# Patient Record
Sex: Male | Born: 1965 | Race: White | Hispanic: No | Smoking: Never smoker
Health system: Southern US, Community
[De-identification: ages and names within clinical notes are randomized; demographics above are authoritative.]

## PROBLEM LIST (undated history)

## (undated) HISTORY — PX: TONSILLECTOMY: SUR1361

## (undated) HISTORY — PX: OTHER SURGICAL HISTORY: SHX169

---

## 1998-05-04 ENCOUNTER — Emergency Department (HOSPITAL_COMMUNITY): Admission: EM | Admit: 1998-05-04 | Discharge: 1998-05-04 | Payer: Self-pay | Admitting: Emergency Medicine

## 2010-06-23 ENCOUNTER — Encounter: Payer: Self-pay | Admitting: Orthopedic Surgery

## 2010-06-23 ENCOUNTER — Emergency Department (HOSPITAL_COMMUNITY): Admission: EM | Admit: 2010-06-23 | Discharge: 2010-06-23 | Payer: Self-pay | Admitting: Emergency Medicine

## 2010-06-25 ENCOUNTER — Ambulatory Visit: Payer: Self-pay | Admitting: Orthopedic Surgery

## 2010-06-25 DIAGNOSIS — S82899A Other fracture of unspecified lower leg, initial encounter for closed fracture: Secondary | ICD-10-CM | POA: Insufficient documentation

## 2010-06-26 ENCOUNTER — Ambulatory Visit (HOSPITAL_COMMUNITY)
Admission: RE | Admit: 2010-06-26 | Discharge: 2010-06-26 | Payer: Self-pay | Source: Home / Self Care | Admitting: Orthopedic Surgery

## 2010-06-26 ENCOUNTER — Ambulatory Visit: Payer: Self-pay | Admitting: Orthopedic Surgery

## 2010-06-28 ENCOUNTER — Ambulatory Visit: Payer: Self-pay | Admitting: Orthopedic Surgery

## 2010-07-10 ENCOUNTER — Ambulatory Visit: Payer: Self-pay | Admitting: Orthopedic Surgery

## 2010-07-17 ENCOUNTER — Ambulatory Visit: Payer: Self-pay | Admitting: Orthopedic Surgery

## 2010-08-07 ENCOUNTER — Ambulatory Visit: Payer: Self-pay | Admitting: Orthopedic Surgery

## 2010-08-08 ENCOUNTER — Encounter: Payer: Self-pay | Admitting: Orthopedic Surgery

## 2010-09-18 ENCOUNTER — Encounter: Payer: Self-pay | Admitting: Orthopedic Surgery

## 2010-09-18 ENCOUNTER — Ambulatory Visit
Admission: RE | Admit: 2010-09-18 | Discharge: 2010-09-18 | Payer: Self-pay | Source: Home / Self Care | Attending: Orthopedic Surgery | Admitting: Orthopedic Surgery

## 2010-09-19 LAB — BASIC METABOLIC PANEL
Chloride: 100 mEq/L (ref 96–112)
GFR calc Af Amer: >60 mL/min (ref >60)
Potassium: 4.2 mEq/L (ref 3.5–5.1)
Sodium: 137 mEq/L (ref 135–145)

## 2010-09-19 LAB — CBC
HCT: 43.8 % (ref 39.0–52.0)
MCV: 90.7 fL (ref 78.0–100.0)
Platelets: 333 10*3/uL (ref 150–400)
RBC: 4.83 MIL/uL (ref 4.22–5.81)
WBC: 7.6 10*3/uL (ref 4.0–10.5)

## 2010-09-21 ENCOUNTER — Ambulatory Visit (HOSPITAL_COMMUNITY)
Admission: RE | Admit: 2010-09-21 | Discharge: 2010-09-21 | Payer: Self-pay | Source: Home / Self Care | Attending: Orthopedic Surgery | Admitting: Orthopedic Surgery

## 2010-09-24 ENCOUNTER — Ambulatory Visit
Admission: RE | Admit: 2010-09-24 | Discharge: 2010-09-24 | Payer: Self-pay | Source: Home / Self Care | Attending: Orthopedic Surgery | Admitting: Orthopedic Surgery

## 2010-09-24 NOTE — Op Note (Addendum)
  [name], Daniel Beard              ACCOUNT NO.:  [id number]  MEDICAL RECORD NO.:  [id number]          PATIENT TYPE:  AMB  LOCATION:  DAY                           FACILITY:  [facility]  PHYSICIAN:  Vickki Hearing, M.D.DATE OF BIRTH:  1966-08-15  DATE OF PROCEDURE:  09/21/2010 DATE OF DISCHARGE:  09/21/2010                              OPERATIVE REPORT   PREOPERATIVE DIAGNOSIS:  Right ankle fracture.  POSTOPERATIVE DIAGNOSIS:  Right ankle fracture.  PROCEDURE:  Hardware removal, right ankle.  SURGEON:  Vickki Hearing, MD  ASSISTED BY:  [place].  OPERATIVE FINDINGS:  Intact syndesmosis screws x2.  Stable stress test of the syndesmosis and tib-fib ligaments.  DETAILS:  A 45 year old male sustained a high fibular fracture, Weber C type syndesmosis disrupture and fracture of the left ankle presented in October 2011.  He was treated with open treatment internal fixation and nonweightbearing for 12 weeks and then presented back for screw removal and exam under anesthesia.  The time-out procedure was executed.  Site marking and chart update were performed.  The patient was taken to the operating suite, where he had general anesthesia after sterile prep and drape of his left lower extremity and execution of the time-out.  A lateral incision was made over the 2 screws.  Full-thickness flaps were created.  Hemostasis was maintained. The 2 screws were backed out to the fibula and then a stress test was done on the tib-fib joint by externally rotating the foot.  There was no motion in the syndesmosis.  The talus was reduced in the mortise and the screws were removed.  The wounds were irrigated and closed with 2-0 Monocryl and 3-0 interrupted nylon sutures and then we injected 20 mL of Marcaine with epinephrine.  Sterile dressings and air cast were applied.  The patient is allowed to weight bear as tolerated with crutches.  He is discharged on ibuprofen and  Percocet.     Vickki Hearing, M.D.     SEH/MEDQ  D:  09/21/2010  T:  09/22/2010  Job:  [id number]  Electronically Signed by Fuller Canada M.D. on 09/24/2010 04:43:35 PM

## 2010-09-25 ENCOUNTER — Ambulatory Visit: Admit: 2010-09-25 | Payer: Self-pay | Admitting: Orthopedic Surgery

## 2010-09-25 NOTE — Assessment & Plan Note (Signed)
Summary: POST OP 1/LT ANKLE SURG 06/26/10/[facility]   Visit Type:  Follow-up Referring Provider:  [facility] er Primary Provider:  Dr. Regino Schultze  CC:  post op 1 ankle.  History of Present Illness: I saw Daniel Beard in the office today for a followup visit.  He is a 45 years old man with the complaint of:  post op 1 left ankle.  OTIF left ankle 06/26/10.  POD 2.  Today for dressing change.  Percocet 5 for pain, helps takes 1 q 4 hrs.  Ibuprofen 800  1 per day.  Doing well.  He is nonweightbearing  He has 2 blisters on his ankle  I advised him that he had a very difficult surgery and required multiple ligament suture repair including suture anchor repair  His x-ray looks acceptable to me at this point.  We will follow closely with x-rays serially including one on his next visit     Allergies: No Known Drug Allergies   Other Orders: Post-Op Check (60454)  Patient Instructions: 1)  return on the 15th for xrays and cast    Orders Added: 1)  Post-Op Check [09811]

## 2010-09-25 NOTE — Assessment & Plan Note (Signed)
Summary: POST OP 2/RECK/XR/LT ANKLE/FINANICAL ASSIST/CAF   Visit Type:  Follow-up Referring Provider:  ap er Primary Provider:  Dr. Regino Schultze  CC:  post op 2 left ankle.  History of Present Illness: I saw Daniel Beard in the office today for a followup visit.  He is a 45 years old man with the complaint of:  post op 1 left ankle.  OTIF left ankle 06/26/10. syndesmosis rupture proximal fibular fracture POD 14.  Today for xrays and cast.  Percocet 5 for pain, helps takes 1 q 4 hrs as needed.  Ibuprofen 800  1 per day.  Doing well. he did fall last week; said he landed on his rear end   He is nonweightbearing with crutches.  He has several blisters on his ankle and foot, which we relieved today and covered with sterile dressings. He is placed back in a posterior splint to allow for recheck next week.  X-rays are obtained today.  3 views of the LEFT ankle, show 2 syndesmotic screws fixating LEFT ankle syndesmosis disruption. His talocrural angle is 83. The spaces around the mortise are 5 mm. His overlap on the AP view tib-fib is 5 mm. 1 cm above the joint is a 6 mm space. All within acceptable range for this injury.        Allergies: No Known Drug Allergies   Impression & Recommendations:  Problem # 1:  AFTERCARE FOLLOW SURGERY MUSCULOSKEL SYSTEM NEC (ICD-V58.78) Assessment Improved  Orders: Post-Op Check (16109) Ankle x-ray complete,  minimum 3 views (60454)  Problem # 2:  FRACTURE, ANKLE, LEFT (ICD-824.8) Assessment: Improved  Orders: Post-Op Check (09811) Ankle x-ray complete,  minimum 3 views (91478)  Patient Instructions: 1)  no weight bearing  2)  see in 1 week check blisters   Orders Added: 1)  Post-Op Check [99024] 2)  Ankle x-ray complete,  minimum 3 views [29562]

## 2010-09-25 NOTE — Assessment & Plan Note (Signed)
Summary: [facility] FOL/UP/FX LT FIB/XR [facility] 06/24/10/SELF PAY/CAF   Vital Signs:  Patient profile:   45 year old male Height:      67 inches Weight:      220 pounds Pulse rate:   76 / minute Resp:     16 per minute  Vitals Entered By: Fuller Canada MD (June 25, 2010 10:49 AM)  Visit Type:  new patient Referring Provider:  [facility] er Primary Provider:  Dr. Regino Schultze  CC:  left tib fib fx and subluxation of left ankle.  History of Present Illness: I saw Daniel Beard in the office today for an initial visit.  He is a 45 years old man with the complaint of:  left fib fx, left ankle syndesmosis rupture   DOI 06/23/10.  Xrays left ankle and tib/fib [facility] 06/23/10.  Meds from er: Percocet 5 number 20.  He c/o pain and swelling ankle and fibula proximally.   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 Respiratory:  Complains of snoring; denies short of breath, wheezing, couch, tightness, pain on inspiration, and snoring . Psychiatric:  Complains of nervousness; denies depression, anxiety, and hallucinations.  The review of systems is negative for Cardiovascular, Gastrointestinal, Genitourinary, Neurologic, Musculoskeletal, Endocrine, Skin, HEENT, Immunology, and Hemoatologic.  Physical Exam  Additional Exam:  GEN: well developed, well nourished, normal grooming and hygiene, no deformity and normal body habitus.   CDV: pulses are normal, no edema, no erythema. no tenderness  Lymph: normal lymph nodes   Skin: no rashes, skin lesions or open sores    NEURO: normal coordination, reflexes, sensation.   Psyche: awake, alert and oriented. Mood normal   Gait: Crutches    The upper extremities have normal appearance, ROM, strength and stability.  The right lower extremity: had normal alignment, ROM, Strength and stability   Inspection swelling of the ankle, with tenderness of the medial malleolar area around the deltoid ligament. Syndesmosis is tender proximally halfway up the leg. Mild tenderness at the proximal fibula.  Range of motion is limited at the ankle joint  Motor exam normal. The ankle is unstable mediolateral.    Impression & Recommendations:  Problem # 1:  FRACTURE, ANKLE, LEFT (ICD-824.8)  LEFT ankle with medial deltoid injury and syndesmosis injury.  Recommend reduction and screw fixation for stabilization.  Patient will be out of work for no weightbearing 12 weeks  Orders: New Patient Level IV (87564) Short Leg Splint (33295)  Patient Instructions: 1)  Surgery left ankle for syndesmosis rupture and fibula fracture  2)  The screws will have to come out later  3)  No weight bearing for 12 weeks    Orders Added: 1)  New Patient Level IV [99204] 2)  Short Leg Splint [29515]

## 2010-09-25 NOTE — Letter (Signed)
Summary: Out of Work  [facility]  [address] Edmund [address]  [place], [place] [address]   Phone: [phone]  Fax: [phone]    June 25, 2010   Employee:  Daniel Beard    To Whom It May Concern:   For Medical reasons, please excuse the above named employee from work for the following dates:  Start:   06/25/2010  End:   12 weeks or until further notice  If you need additional information, please feel free to contact our office.         Sincerely,    [name]

## 2010-09-25 NOTE — Letter (Signed)
Summary: History form  History form   Imported By: Jacklynn Ganong 06/27/2010 08:38:42  _____________________________________________________________________  External Attachment:    Type:   Image     Comment:   External Document

## 2010-09-25 NOTE — Assessment & Plan Note (Signed)
Summary: CK LT ANKLE/BLISTERS/FX CARE/100% [facility] DISC/CAF   Visit Type:  Follow-up Referring Provider:  ap er Primary Provider:  Dr. Regino Schultze  CC:  POST OP ANKLE.  History of Present Illness: DOS date of surgery was June 26, 2010  Procedure open treatment internal fixation of LEFT ankle syndesmosis rupture with proximal fibular fracture.  This is postop day #21 the patient comes in continues on Percocet 5 mg for pain although is not taking much.  He is not taking any ibuprofen.  He did complain of some muscle spasms.  We are evaluating his skin today planned bone a short leg cast  the incision has improved significantly with improvement of the blisters which have resolved the incision line looks good  Patient is placed in a short leg nonweightbearing cast he'll come back for x-rays 3 weeks postop a 42 with the plan of x-rays out of plaster and then possibly starting weightbearing and cast or immobilizer          Allergies (verified): No Known Drug Allergies   Impression & Recommendations:  Problem # 1:  AFTERCARE FOLLOW SURGERY MUSCULOSKEL SYSTEM NEC (ICD-V58.78) Assessment Improved  Orders: Post-Op Check (16109)  Problem # 2:  FRACTURE, ANKLE, LEFT (ICD-824.8) Assessment: Improved  Orders: Post-Op Check (60454)  Patient Instructions: 1)  Please schedule a follow-up appointment in 3 weeks. 2)  xray oop   Orders Added: 1)  Post-Op Check [09811]

## 2010-09-27 NOTE — Letter (Signed)
Summary: surgery order LT ankle scheduled 09/21/10   surgery order LT ankle scheduled 09/21/10   Imported By: Cammie Sickle 09/18/2010 20:49:25  _____________________________________________________________________  External Attachment:    Type:   Image     Comment:   External Document

## 2010-09-27 NOTE — Letter (Signed)
Summary: Medical record request  Medical record request   Imported By: Jacklynn Ganong 08/08/2010 12:17:55  _____________________________________________________________________  External Attachment:    Type:   Image     Comment:   External Document

## 2010-09-27 NOTE — Assessment & Plan Note (Signed)
Summary: 3 WK RE-CK/XRAY OOP LT ANKLE/100% M.C.DISCOUNT/CAF   Visit Type:  Follow-up Referring Provider:  ap er Primary Provider:  Dr. Regino Schultze  CC:  left ankle fracture.  History of Present Illness:  DOS 11.1.2011  Procedure open treatment internal fixation of LEFT ankle syndesmosis rupture with proximal fibular fracture.  Medication  Percocet 5 mg, very little.  Subjectives: some pain across the top of his foot.  We are evaluating his skin today and xray OOP.  The skin has recovered. He is placed back in a short leg nonweightbearing cast is 3. X-rays today show maintenance of position of the screws. The mortise with equal distant medial clear space. Superior clear space. 85 talocrural angle, tib-fib overlap was about 7 mm, and the tibial talar space was about 6 mm    Allergies: No Known Drug Allergies   Impression & Recommendations:  Problem # 1:  AFTERCARE FOLLOW SURGERY MUSCULOSKEL SYSTEM NEC (ICD-V58.78)  Orders: Post-Op Check (16109) Ankle x-ray complete,  minimum 3 views (60454)  Problem # 2:  FRACTURE, ANKLE, LEFT (ICD-824.8)  Orders: Post-Op Check (09811) Ankle x-ray complete,  minimum 3 views (91478)  Patient Instructions: 1)  6 weeks cast off xrays and preop for screw removal    Orders Added: 1)  Post-Op Check [99024] 2)  Ankle x-ray complete,  minimum 3 views [29562]

## 2010-09-27 NOTE — Assessment & Plan Note (Signed)
Summary: 6 WK RE-CK/XRAYS LT ANKLE+DISCUSS SURGERY/100%MC DISC/CAF  CC: post op ankle   Visit Type:  Follow-up Referring Provider:  ap er Primary Provider:  Dr. Regino Schultze  CC:  post op ankle.  History of Present Illness: 45 year old male, status post open treatment internal fixation, LEFT ankle syndesmosis rupture with proximal fibular fracture. He is now on the 12th week and he comes in today to check his skin and to x-ray him out of plaster and to set up a time to remove the syndesmosis screw was  Original date of surgery was August 26, 2009.  Current medication Percocet 5 mg, but is not taking it because he is not having any pain.  His been nonweightbearing.        Allergies: No Known Drug Allergies  Past History:  Past Medical History: Last updated: 06/25/2010 na  Family History: Last updated: 06/25/2010 na  Past Surgical History: OTIF LEFT ANKLE SYNDESMOSIS 2011  Family History: Reviewed history from 06/25/2010 and no changes required. na  Social History: Reviewed history from 06/25/2010 and no changes required. 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 Cardiovascular:  Denies chest pain, palpitations, fainting, and murmurs. Respiratory:  Denies short of breath, wheezing, couch, tightness, pain on inspiration, and snoring . Gastrointestinal:  Denies heartburn, nausea, vomiting, diarrhea, constipation, and blood in your stools. Genitourinary:  Denies frequency, urgency, difficulty urinating, painful urination, flank pain, and bleeding in urine. Neurologic:  Denies numbness, tingling, unsteady gait, dizziness, tremors, and seizure. Musculoskeletal:  See HPI. Endocrine:  Denies excessive thirst, exessive urination, and heat or cold intolerance. Psychiatric:  Denies nervousness, depression,  anxiety, and hallucinations. Skin:  Denies changes in the skin, poor healing, rash, itching, and redness. HEENT:  Denies blurred or double vision, eye pain, redness, and watering. Immunology:  Denies seasonal allergies, sinus problems, and allergic to bee stings. Hemoatologic:  Denies easy bleeding and brusing.  Physical Exam  General:  Well developed, well nourished, normal body habitus; no deformities, normal grooming. Head:  normocephalic and atraumatic Neck:  no masses, thyromegaly, or abnormal cervical nodes Lungs:  clear bilaterally to A & P Heart:  regular rate and rhythm, S1, S2 without murmurs, rubs, gallops, or clicks Abdomen:  bowel sounds positive; abdomen soft and non-tender without masses, organomegaly, or hernias noted Msk:  range of motion of the LEFT ankle is approximately 15 he seems to have a spasm and the tibialis anterior. Pulses:  pulses normal in all 4 extremities Extremities:  no clubbing, cyanosis, edema, or deformity noted with normal full range of motion of all joints Neurologic:  no focal deficits, CN II-XII grossly intact with normal reflexes, coordination, muscle strength and tone Skin:  skin incision is well-healed over the LEFT ankle.   Impression & Recommendations:  Problem # 1:  AFTERCARE FOLLOW SURGERY MUSCULOSKEL SYSTEM NEC (ICD-V58.78)  Orders: Post-Op Check (16109) Ankle x-ray complete,  minimum 3 views (60454)  Problem # 2:  FRACTURE, ANKLE, LEFT (ICD-824.8)  plan removal of syndesmosis screws. Exam under anesthesia and application of Aircast left ankle   If for some reason, the syndesmosis is still not healed we will reinsert the screws.  Orders: Post-Op Check (09811) Ankle x-ray complete,  minimum 3 views (91478)  Patient Instructions: 1)  Surgery 09/21/10 2)  Preop at [facility] stay center, take packet with you, I will call you today and  let you know when to go 3)  Post op 1 in our office 09/24/10   Orders Added: 1)   Post-Op Check [99024] 2)  Ankle x-ray complete,  minimum 3 views [73610]

## 2010-10-02 ENCOUNTER — Encounter: Payer: Self-pay | Admitting: Orthopedic Surgery

## 2010-10-02 ENCOUNTER — Ambulatory Visit (INDEPENDENT_AMBULATORY_CARE_PROVIDER_SITE_OTHER): Payer: Self-pay | Admitting: Orthopedic Surgery

## 2010-10-02 DIAGNOSIS — S82899A Other fracture of unspecified lower leg, initial encounter for closed fracture: Secondary | ICD-10-CM

## 2010-10-02 DIAGNOSIS — Z4789 Encounter for other orthopedic aftercare: Secondary | ICD-10-CM

## 2010-10-03 NOTE — Assessment & Plan Note (Signed)
Summary: Post op 07/26/11 LT ankle/100% disc/wkj   Visit Type:  Follow-up Referring Provider:  [facility] er Primary Provider:  Dr. Regino Schultze  CC:  post op 1 right ankle.Daniel Beard  History of Present Illness:   Original date of surgery was 06/26/2010.  On 09/21/10 had Hardware removal right ankle.  POD 3.  Meds: Ibuprofen 800mg  has not been taking,  and Percocet 5mg  1 po for pain as needed.  Has not put any weight on the ankle, says he does not have any shoes that will fit over air cast.  Pain level today is around 0 now.  he is now noting incision except he has some abrasion type, superficial epithelium. That has peeled from the skin. Interrupted sutures and the wound.  Recommend followup tomorrow 4 need to try to fit the Aircast in his sneaker          Allergies: No Known Drug Allergies   Impression & Recommendations:  Problem # 1:  AFTERCARE FOLLOW SURGERY MUSCULOSKEL SYSTEM NEC (ICD-V58.78) Assessment Comment Only  Orders: Post-Op Check (16109)  Problem # 2:  FRACTURE, ANKLE, LEFT (ICD-824.8) Assessment: Comment Only  Orders: Post-Op Check (60454)  Patient Instructions: 1)  Will come in tomorrow for air cast shoe fitting    Orders Added: 1)  Post-Op Check [09811]

## 2010-10-04 ENCOUNTER — Ambulatory Visit (HOSPITAL_COMMUNITY)
Admission: RE | Admit: 2010-10-04 | Discharge: 2010-10-04 | Disposition: A | Discharge status: Home / Self Care | Payer: Self-pay | Source: Ambulatory Visit

## 2010-10-04 DIAGNOSIS — Z139 Encounter for screening, unspecified: Secondary | ICD-10-CM | POA: Insufficient documentation

## 2010-10-08 ENCOUNTER — Ambulatory Visit (HOSPITAL_COMMUNITY)
Admission: RE | Admit: 2010-10-08 | Discharge: 2010-10-08 | Disposition: A | Discharge status: Home / Self Care | Payer: Self-pay | Source: Ambulatory Visit

## 2010-10-10 ENCOUNTER — Ambulatory Visit (HOSPITAL_COMMUNITY)
Admission: RE | Admit: 2010-10-10 | Discharge: 2010-10-10 | Disposition: A | Discharge status: Home / Self Care | Payer: Self-pay | Source: Ambulatory Visit

## 2010-10-11 NOTE — Assessment & Plan Note (Signed)
Summary: sutures out post op 09/21/10./discount/bsf   Visit Type:  Follow-up Referring Provider:  ap er Primary Provider:  Dr. Regino Schultze  CC:  post op 2.  History of Present Illness:   Original date of surgery was 06/26/2010.  On 09/21/10 had Hardware removal right ankle.  POD 11  Meds: No meds needed.  Today is post op visit #2 for suture removal    Patient reports no pain at this time.  He started some weightbearing with his Aircast and his crutches               Allergies: No Known Drug Allergies  Physical Exam  Additional Exam:   One check.  Wound has healed nicely. Patient still has Achilles tendon tightness and stiffness especially with dorsiflexion of the LEFT ankle.  Recommend physical therapy   Impression & Recommendations:  Problem # 1:  AFTERCARE FOLLOW SURGERY MUSCULOSKEL SYSTEM NEC (ICD-V58.78) Assessment Improved  Orders: Physical Therapy Referral (PT) Post-Op Check (09811)  Problem # 2:  FRACTURE, ANKLE, LEFT (ICD-824.8) Assessment: Improved  Orders: Physical Therapy Referral (PT) Post-Op Check (91478)  Patient Instructions: 1)  PHYSICAL THERAPY X 4 WEEKS  2)  ROM EXERCISES  3)  WBAT IN AIR CAST, WEAN CRUTCHES  4)  COME BACK IN 4 WEEKS    Orders Added: 1)  Physical Therapy Referral [PT] 2)  Post-Op Check [29562]

## 2010-10-12 ENCOUNTER — Ambulatory Visit (HOSPITAL_COMMUNITY)
Admission: RE | Admit: 2010-10-12 | Discharge: 2010-10-12 | Disposition: A | Discharge status: Home / Self Care | Payer: Self-pay | Source: Ambulatory Visit | Attending: *Deleted | Admitting: *Deleted

## 2010-10-15 ENCOUNTER — Ambulatory Visit (HOSPITAL_COMMUNITY): Payer: Self-pay

## 2010-10-17 ENCOUNTER — Ambulatory Visit (HOSPITAL_COMMUNITY)
Admission: RE | Admit: 2010-10-17 | Discharge: 2010-10-17 | Disposition: A | Discharge status: Home / Self Care | Payer: Self-pay | Source: Ambulatory Visit | Attending: *Deleted | Admitting: *Deleted

## 2010-10-19 ENCOUNTER — Ambulatory Visit (HOSPITAL_COMMUNITY)
Admission: RE | Admit: 2010-10-19 | Discharge: 2010-10-19 | Disposition: A | Discharge status: Home / Self Care | Payer: Self-pay | Source: Ambulatory Visit | Attending: *Deleted | Admitting: *Deleted

## 2010-10-22 ENCOUNTER — Ambulatory Visit (HOSPITAL_COMMUNITY)
Admission: RE | Admit: 2010-10-22 | Discharge: 2010-10-22 | Disposition: A | Discharge status: Home / Self Care | Payer: Self-pay | Source: Ambulatory Visit | Attending: *Deleted | Admitting: *Deleted

## 2010-10-24 ENCOUNTER — Ambulatory Visit (HOSPITAL_COMMUNITY)
Admission: RE | Admit: 2010-10-24 | Discharge: 2010-10-24 | Disposition: A | Discharge status: Home / Self Care | Payer: Self-pay | Source: Ambulatory Visit | Attending: *Deleted | Admitting: *Deleted

## 2010-10-24 ENCOUNTER — Encounter: Payer: Self-pay | Admitting: Orthopedic Surgery

## 2010-10-26 ENCOUNTER — Ambulatory Visit (HOSPITAL_COMMUNITY)
Admission: RE | Admit: 2010-10-26 | Discharge: 2010-10-26 | Disposition: A | Discharge status: Home / Self Care | Payer: Self-pay | Source: Ambulatory Visit | Attending: Orthopedic Surgery | Admitting: Orthopedic Surgery

## 2010-10-26 DIAGNOSIS — M25579 Pain in unspecified ankle and joints of unspecified foot: Secondary | ICD-10-CM | POA: Insufficient documentation

## 2010-10-26 DIAGNOSIS — M25673 Stiffness of unspecified ankle, not elsewhere classified: Secondary | ICD-10-CM | POA: Insufficient documentation

## 2010-10-26 DIAGNOSIS — R262 Difficulty in walking, not elsewhere classified: Secondary | ICD-10-CM | POA: Insufficient documentation

## 2010-10-26 DIAGNOSIS — M25676 Stiffness of unspecified foot, not elsewhere classified: Secondary | ICD-10-CM | POA: Insufficient documentation

## 2010-10-26 DIAGNOSIS — M6281 Muscle weakness (generalized): Secondary | ICD-10-CM | POA: Insufficient documentation

## 2010-10-26 DIAGNOSIS — IMO0001 Reserved for inherently not codable concepts without codable children: Secondary | ICD-10-CM | POA: Insufficient documentation

## 2010-10-29 ENCOUNTER — Ambulatory Visit (HOSPITAL_COMMUNITY)
Admission: RE | Admit: 2010-10-29 | Discharge: 2010-10-29 | Disposition: A | Discharge status: Home / Self Care | Payer: Self-pay | Source: Ambulatory Visit | Attending: *Deleted | Admitting: *Deleted

## 2010-10-30 ENCOUNTER — Encounter: Payer: Self-pay | Admitting: Orthopedic Surgery

## 2010-10-30 ENCOUNTER — Ambulatory Visit (INDEPENDENT_AMBULATORY_CARE_PROVIDER_SITE_OTHER): Payer: Self-pay | Admitting: Orthopedic Surgery

## 2010-10-30 DIAGNOSIS — S82899A Other fracture of unspecified lower leg, initial encounter for closed fracture: Secondary | ICD-10-CM

## 2010-10-30 DIAGNOSIS — Z4789 Encounter for other orthopedic aftercare: Secondary | ICD-10-CM

## 2010-10-31 ENCOUNTER — Ambulatory Visit (HOSPITAL_COMMUNITY)
Admission: RE | Admit: 2010-10-31 | Discharge: 2010-10-31 | Disposition: A | Discharge status: Home / Self Care | Payer: Self-pay | Source: Ambulatory Visit | Attending: *Deleted | Admitting: *Deleted

## 2010-11-02 ENCOUNTER — Ambulatory Visit (HOSPITAL_COMMUNITY)
Admission: RE | Admit: 2010-11-02 | Discharge: 2010-11-02 | Disposition: A | Discharge status: Home / Self Care | Payer: Self-pay | Source: Ambulatory Visit | Attending: Orthopedic Surgery | Admitting: Orthopedic Surgery

## 2010-11-05 ENCOUNTER — Ambulatory Visit (HOSPITAL_COMMUNITY)
Admission: RE | Admit: 2010-11-05 | Discharge: 2010-11-05 | Disposition: A | Discharge status: Home / Self Care | Payer: Self-pay | Source: Ambulatory Visit | Attending: Orthopedic Surgery | Admitting: Orthopedic Surgery

## 2010-11-06 NOTE — Miscellaneous (Signed)
Summary: PT Clinical evaluation  PT Clinical evaluation   Imported By: Jacklynn Ganong 10/29/2010 13:32:44  _____________________________________________________________________  External Attachment:    Type:   Image     Comment:   External Document

## 2010-11-06 NOTE — Assessment & Plan Note (Signed)
Summary: 4 WK RE-CK LT ANKLE/100% M.[facility] DISC/CAF   Visit Type:  Follow-up Referring Provider:  ap er Primary Provider:  Dr. Regino Schultze  CC:  post op ankle.  History of Present Illness:   Original date of surgery was 06/26/2010.  On 09/21/10 had Hardware removal right ankle.  Meds: Ibuprofen 800mg  helps  Today is post op visit #3 for recheck after weightbearing with aircast, PT.  Still some stiffness and discomfort.  Still in PT at [facility].  Pain level is around 0.                 Allergies: No Known Drug Allergies  Physical Exam  Additional Exam:   I reviewed his physical therapy, notes she's making progress with his dorsiflexion, which is now to neutral. He has 40 of plantar flexion. His ankle is stable.  His Achilles tendon shows some tightness.   Impression & Recommendations:  Problem # 1:  AFTERCARE FOLLOW SURGERY MUSCULOSKEL SYSTEM NEC (ICD-V58.78) Assessment Improved  Orders: Post-Op Check (81191)  Problem # 2:  FRACTURE, ANKLE, LEFT (ICD-824.8) Assessment: Improved  Orders: Post-Op Check (47829)  Patient Instructions: 1)  BRACE OFF/ 2)   FINISH THERAPY RETURN IN 6 WEEKS    Orders Added: 1)  Post-Op Check [56213]

## 2010-11-07 ENCOUNTER — Ambulatory Visit (HOSPITAL_COMMUNITY)
Admission: RE | Admit: 2010-11-07 | Discharge: 2010-11-07 | Disposition: A | Discharge status: Home / Self Care | Payer: Self-pay | Source: Ambulatory Visit | Attending: Family Medicine | Admitting: Family Medicine

## 2010-11-07 LAB — BASIC METABOLIC PANEL
BUN: 11 mg/dL (ref 6–23)
GFR calc non Af Amer: >60 mL/min (ref >60)
Potassium: 4.1 mEq/L (ref 3.5–5.1)

## 2010-11-07 LAB — SURGICAL PCR SCREEN
MRSA, PCR: NEGATIVE
Staphylococcus aureus: POSITIVE — AB

## 2010-11-07 LAB — CBC
HCT: 43.9 % (ref 39.0–52.0)
Platelets: 319 10*3/uL (ref 150–400)
RDW: 13.2 % (ref 11.5–15.5)
WBC: 8.6 10*3/uL (ref 4.0–10.5)

## 2010-11-09 ENCOUNTER — Ambulatory Visit (HOSPITAL_COMMUNITY)
Admission: RE | Admit: 2010-11-09 | Discharge: 2010-11-09 | Disposition: A | Discharge status: Home / Self Care | Payer: Self-pay | Source: Ambulatory Visit | Attending: *Deleted | Admitting: *Deleted

## 2010-11-12 ENCOUNTER — Ambulatory Visit (HOSPITAL_COMMUNITY)
Admission: RE | Admit: 2010-11-12 | Discharge: 2010-11-12 | Disposition: A | Discharge status: Home / Self Care | Payer: Self-pay | Source: Ambulatory Visit | Attending: *Deleted | Admitting: *Deleted

## 2010-11-13 NOTE — Miscellaneous (Signed)
Summary: PT Progress note  PT Progress note   Imported By: Jacklynn Ganong 11/06/2010 11:07:47  _____________________________________________________________________  External Attachment:    Type:   Image     Comment:   External Document

## 2010-11-14 ENCOUNTER — Ambulatory Visit (HOSPITAL_COMMUNITY)
Admission: RE | Admit: 2010-11-14 | Discharge: 2010-11-14 | Disposition: A | Discharge status: Home / Self Care | Payer: Self-pay | Source: Ambulatory Visit | Attending: *Deleted | Admitting: *Deleted

## 2010-11-16 ENCOUNTER — Ambulatory Visit (HOSPITAL_COMMUNITY)
Admission: RE | Admit: 2010-11-16 | Discharge: 2010-11-16 | Disposition: A | Discharge status: Home / Self Care | Payer: Self-pay | Source: Ambulatory Visit

## 2010-12-11 ENCOUNTER — Ambulatory Visit: Payer: Self-pay | Admitting: Orthopedic Surgery

## 2010-12-12 ENCOUNTER — Encounter: Payer: Self-pay | Admitting: Orthopedic Surgery

## 2017-05-24 ENCOUNTER — Emergency Department (HOSPITAL_COMMUNITY)
Admission: EM | Admit: 2017-05-24 | Discharge: 2017-05-24 | Disposition: A | Discharge status: Home / Self Care | Payer: BLUE CROSS/BLUE SHIELD | Attending: Emergency Medicine | Admitting: Emergency Medicine

## 2017-05-24 ENCOUNTER — Encounter (HOSPITAL_COMMUNITY): Payer: Self-pay | Admitting: Emergency Medicine

## 2017-05-24 ENCOUNTER — Emergency Department (HOSPITAL_COMMUNITY): Payer: BLUE CROSS/BLUE SHIELD

## 2017-05-24 DIAGNOSIS — M545 Low back pain: Secondary | ICD-10-CM | POA: Diagnosis present

## 2017-05-24 DIAGNOSIS — M5136 Other intervertebral disc degeneration, lumbar region: Secondary | ICD-10-CM | POA: Diagnosis not present

## 2017-05-24 DIAGNOSIS — R3915 Urgency of urination: Secondary | ICD-10-CM | POA: Insufficient documentation

## 2017-05-24 LAB — URINALYSIS, ROUTINE W REFLEX MICROSCOPIC
Bacteria, UA: NONE SEEN
Bilirubin Urine: NEGATIVE
GLUCOSE, UA: NEGATIVE mg/dL
HGB URINE DIPSTICK: NEGATIVE
KETONES UR: NEGATIVE mg/dL
NITRITE: NEGATIVE
PH: 6 (ref 5.0–8.0)
PROTEIN: NEGATIVE mg/dL
Specific Gravity, Urine: 1.018 (ref 1.005–1.030)

## 2017-05-24 MED ORDER — PREDNISONE 10 MG PO TABS: ORAL_TABLET | ORAL | 0 refills | Status: AC

## 2017-05-24 MED ORDER — HYDROCODONE-ACETAMINOPHEN 5-325 MG PO TABS: 1.0000 | ORAL_TABLET | ORAL | 0 refills | Status: DC | PRN

## 2017-05-24 MED ORDER — IBUPROFEN 800 MG PO TABS
800.0000 mg | ORAL_TABLET | Freq: Once | ORAL | Status: AC
  Administered 2017-05-24: 800 mg via ORAL
  Filled 2017-05-24: qty 1

## 2017-05-24 NOTE — ED Triage Notes (Signed)
Pain to lower back.  Pain is chronic for last year.  Rates pain 6/10.

## 2017-05-24 NOTE — Discharge Instructions (Signed)
Take your next dose of prednisone tomorrow evening.  Use the the other medicines as directed.  Do not drive within 4 hours of taking hydrocodone as this will make you drowsy.  Avoid lifting,  Bending,  Twisting or any other activity that worsens your pain over the next week.  Apply an  icepack  to your lower back for 10-15 minutes every 2 hours for the next 2 days.  You should get rechecked if your symptoms are not better over the next 5 days,  Or you develop increased pain,  Weakness in your leg(s) or loss of bladder or bowel function - these are symptoms of a worse injury.

## 2017-05-24 NOTE — ED Provider Notes (Signed)
[facility]ER[facility]Y DEPT Provider Note   CSN: [id number] Arrival date & time: 05/24/17  1746     History   Chief Complaint Chief Complaint  Patient presents with  . Back Pain    HPI Daniel Beard is a 51 y.o. male presenting with a one year history of intermittent low back pain and urinary incontinence.  He denies any acute back injury but works as a Nature conservation officer at [employer].  He describes acutely worse pain with radiation into the left leg to his knee today.  He denies saddle anesthesia or fecal incontinence or retention.  He states he has urinary urgency and incontinence intermittently, but most days x 1+ year and has resorted to wearing depends. He denies numbness or weakness in his legs.  He takes anti inflammatories, sometime with relief.  The history is provided by the patient.    History reviewed. No pertinent past medical history.  Patient Active Problem List   Diagnosis Date Noted  . FRACTURE, ANKLE, LEFT 06/25/2010    Past Surgical History:  Procedure Laterality Date  . ankle     left   . TONSILLECTOMY         Home Medications    Prior to Admission medications   Medication Sig Start Date End Date Taking? Authorizing Provider  HYDROcodone-acetaminophen (NORCO/VICODIN) 5-325 MG tablet Take 1 tablet by mouth every 4 (four) hours as needed. 05/24/17   Burgess Amor, PA-C  predniSONE (DELTASONE) 10 MG tablet Take 6 tablets day one, 5 tablets day two, 4 tablets day three, 3 tablets day four, 2 tablets day five, then 1 tablet day six 05/24/17   Victoriano Lain    Family History History reviewed. No pertinent family history.  Social History Social History  Substance Use Topics  . Smoking status: Never Smoker  . Smokeless tobacco: Never Used  . Alcohol use No     Allergies   Patient has no known allergies.   Review of Systems Review of Systems  Constitutional: Negative for fever.  Respiratory: Negative for shortness of breath.   Cardiovascular:  Negative for chest pain and leg swelling.  Gastrointestinal: Negative for abdominal distention, abdominal pain and constipation.  Genitourinary: Positive for urgency. Negative for difficulty urinating, dysuria, flank pain and frequency.  Musculoskeletal: Positive for back pain. Negative for gait problem and joint swelling.  Skin: Negative for rash.  Neurological: Negative for weakness and numbness.     Physical Exam Updated Vital Signs BP (!) 152/77 (BP Location: Left Arm)   Pulse 80   Temp 97.7 F (36.5 C) (Oral)   Resp 18   Ht  (1.702 m)   Wt 88.5 kg (195 lb)   SpO2 100%   BMI 30.54 kg/m   Physical Exam  Constitutional: He appears well-developed and well-nourished.  HENT:  Head: Normocephalic.  Eyes: Conjunctivae are normal.  Neck: Normal range of motion. Neck supple.  Cardiovascular: Normal rate and intact distal pulses.   Pedal pulses normal.  Pulmonary/Chest: Effort normal.  Abdominal: Soft. Bowel sounds are normal. He exhibits no distension and no mass.  Musculoskeletal: Normal range of motion. He exhibits no edema.       Lumbar back: He exhibits tenderness. He exhibits no swelling, no edema and no spasm.  Neurological: He is alert. He has normal strength. He displays no atrophy and no tremor. No sensory deficit. Gait normal.  Reflex Scores:      Patellar reflexes are 3+ on the right side and 3+ on the  left side.      Achilles reflexes are 2+ on the right side and 2+ on the left side. No strength deficit noted in hip and knee flexor and extensor muscle groups.  Ankle flexion and extension intact.  Skin: Skin is warm and dry.  Psychiatric: He has a normal mood and affect.  Nursing note and vitals reviewed.    ED Treatments / Results  Labs (all labs ordered are listed, but only abnormal results are displayed) Labs Reviewed  URINALYSIS, ROUTINE W REFLEX MICROSCOPIC - Abnormal; Notable for the following:       Result Value   Leukocytes, UA TRACE (*)     Squamous Epithelial / LPF 0-5 (*)    All other components within normal limits    EKG  EKG Interpretation None       Radiology Ct Lumbar Spine Wo Contrast  Result Date: 05/24/2017 CLINICAL DATA:  Chronic lower back pain. EXAM: CT LUMBAR SPINE WITHOUT CONTRAST TECHNIQUE: Multidetector CT imaging of the lumbar spine was performed without intravenous contrast administration. Multiplanar CT image reconstructions were also generated. COMPARISON:  None. FINDINGS: Segmentation: 5 lumbar type vertebrae. Alignment: Normal. Vertebrae: No acute fracture or focal pathologic process. Paraspinal and other soft tissues: Right nephrolithiasis is noted. No paraspinal abnormality is noted. No significant central spinal canal stenosis is noted. Disc levels: Mild degenerative disc disease is noted at L4-5. Moderate degenerative disc disease is noted at L5-S1. Anterior osteophyte formation is noted at L2-3, L3-4, L4-5 and L5-S1. IMPRESSION: Multilevel degenerative disc disease is noted. No acute abnormality is noted in the lumbar spine. Right nonobstructive nephrolithiasis is noted. Electronically Signed   By: Lupita Raider, M.D.   On: 05/24/2017 19:23    Procedures Procedures (including critical care time)  Medications Ordered in ED Medications  ibuprofen (ADVIL,MOTRIN) tablet 800 mg (800 mg Oral Given 05/24/17 1835)     Initial Impression / Assessment and Plan / ED Course  I have reviewed the triage vital signs and the nursing notes.  Pertinent labs & imaging results that were available during my care of the patient were reviewed by me and considered in my medical decision making (see chart for details).     Pt with ddd per CT imaging.  Urinary incontinence and urgency but chronic sx and not a persistent sx.  He was referred to neurosurgery, also urology referral given, urinary sx could be prostate related which was discussed with pt.  Suggested trial of azo. Prednisone taper, hydrocodone. Discussed  ice, heat, activity as tolerated.  [place] controlled substance database reviewed.\ The patient appears reasonably screened and/or stabilized for discharge and I doubt any other medical condition or other [facility] requiring further screening, evaluation, or treatment in the ED at this time prior to discharge.         Final Clinical Impressions(s) / ED Diagnoses   Final diagnoses:  DDD (degenerative disc disease), lumbar  Urinary urgency    New Prescriptions Discharge Medication List as of 05/24/2017  8:22 PM    START taking these medications   Details  HYDROcodone-acetaminophen (NORCO/VICODIN) 5-325 MG tablet Take 1 tablet by mouth every 4 (four) hours as needed., Starting Sat 05/24/2017, Print    predniSONE (DELTASONE) 10 MG tablet Take 6 tablets day one, 5 tablets day two, 4 tablets day three, 3 tablets day four, 2 tablets day five, then 1 tablet day six, Print         Victoriano Lain 05/24/17 2042    Samuel Jester, DO  05/24/17 2321  

## 2018-03-07 IMAGING — CT CT L SPINE W/O CM
3 of 4 series · 12 of 33 positions shown, 14 images · non-contrast
Comparison: None.

CLINICAL DATA: Chronic lower back pain.

EXAM:
CT LUMBAR SPINE WITHOUT CONTRAST
TECHNIQUE: Multidetector CT imaging of the lumbar spine was performed without
intravenous contrast administration. Multiplanar CT image
reconstructions were also generated.

[Series 4: l spine soft · axial · 0.25mm/px · z∈[+696,+856]mm · 4 of 120 slices shown, 5 images]
[im 20/120  soft-tissue]
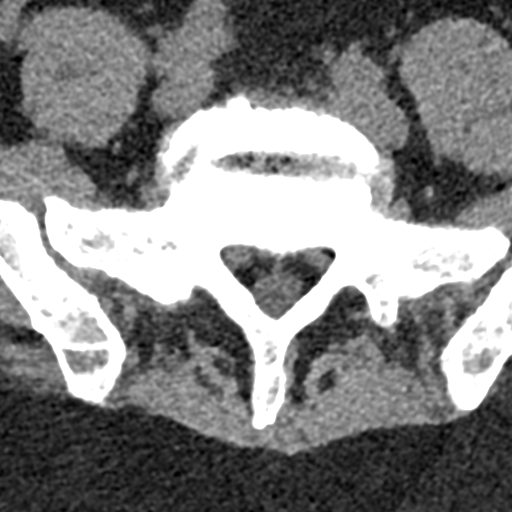
[im 20/120  bone]
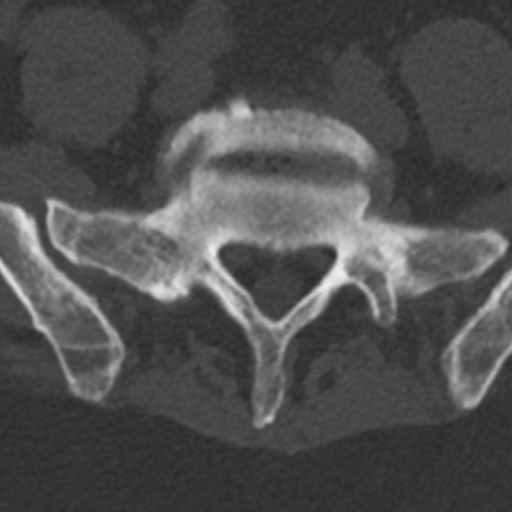
[im 40/120  bone]
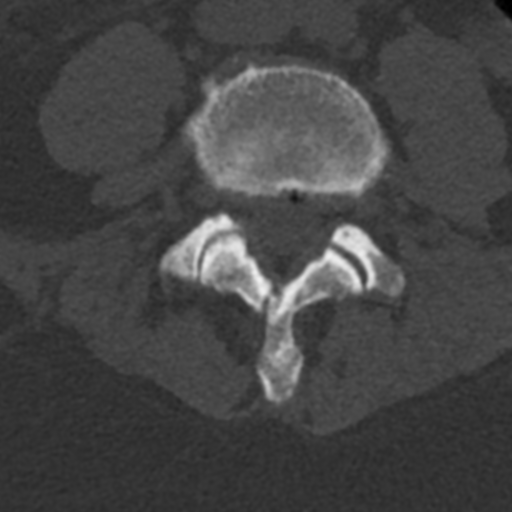
[im 80/120  bone]
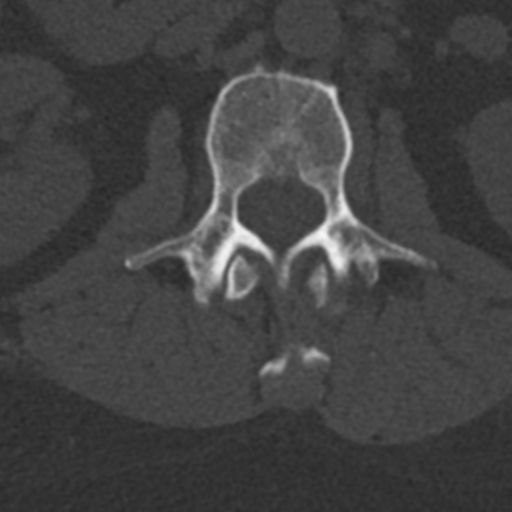
[im 100/120  bone]
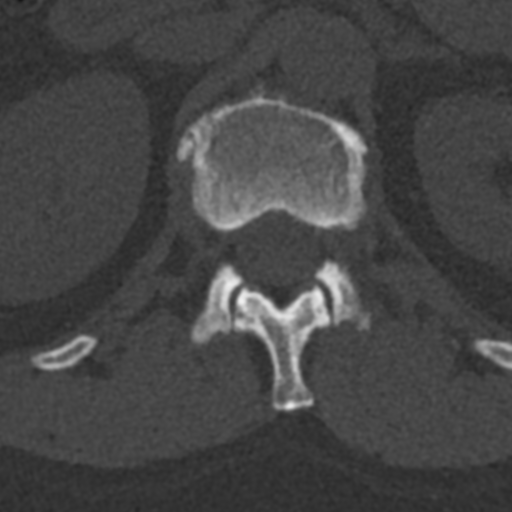

[Series 5: sagittal bone · sagittal · 0.28mm/px · 5 of 71 slices shown, 6 images]
[im 24/71  bone]
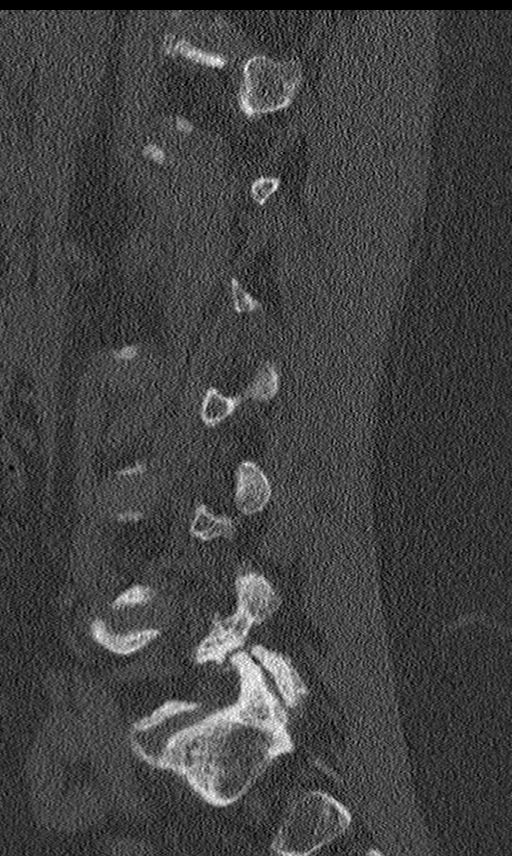
[im 30/71  bone]
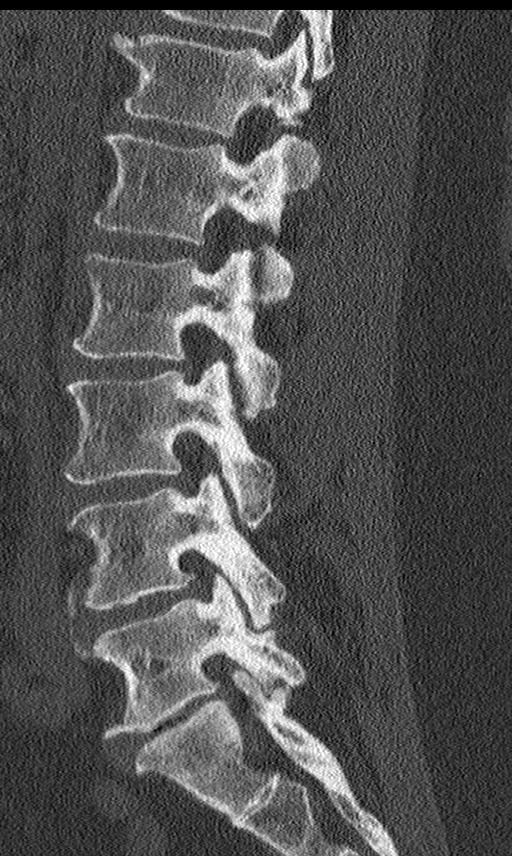
[im 36/71  soft-tissue]
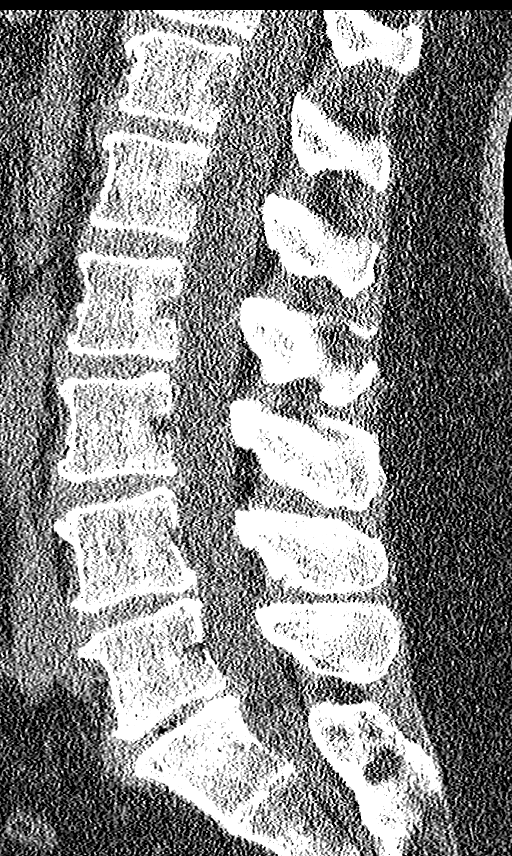
[im 36/71  bone]
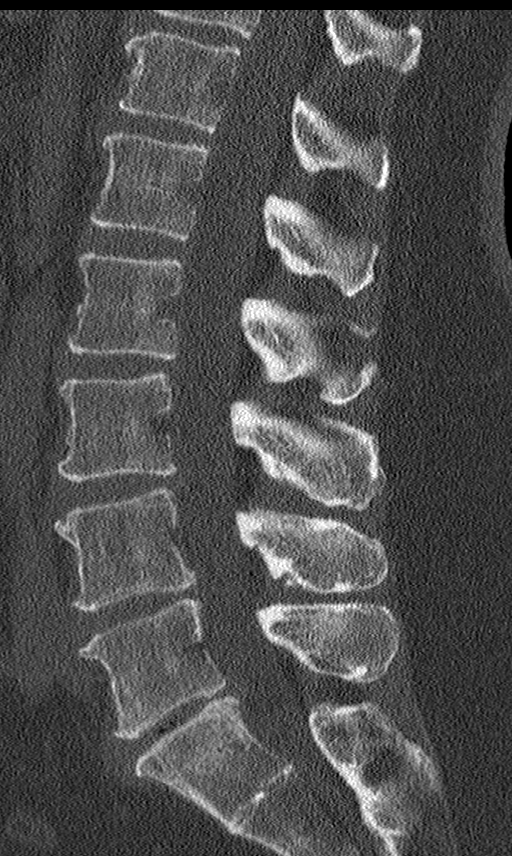
[im 41/71  bone]
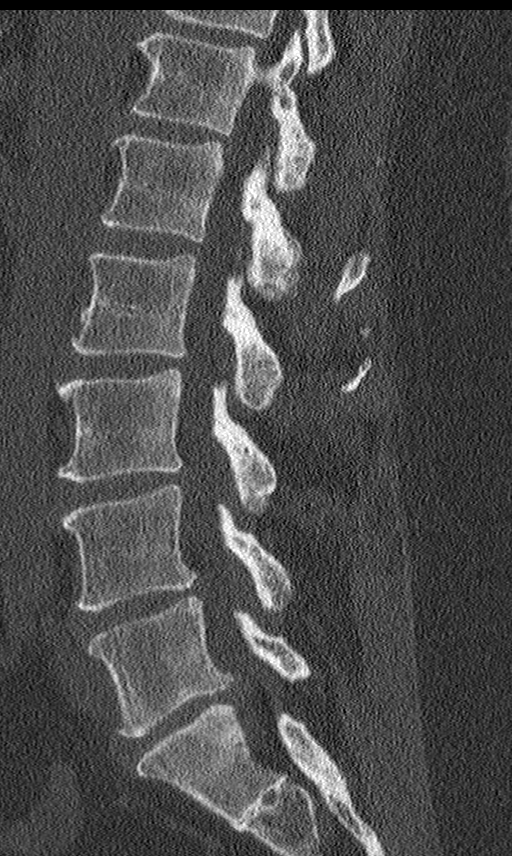
[im 47/71  bone]
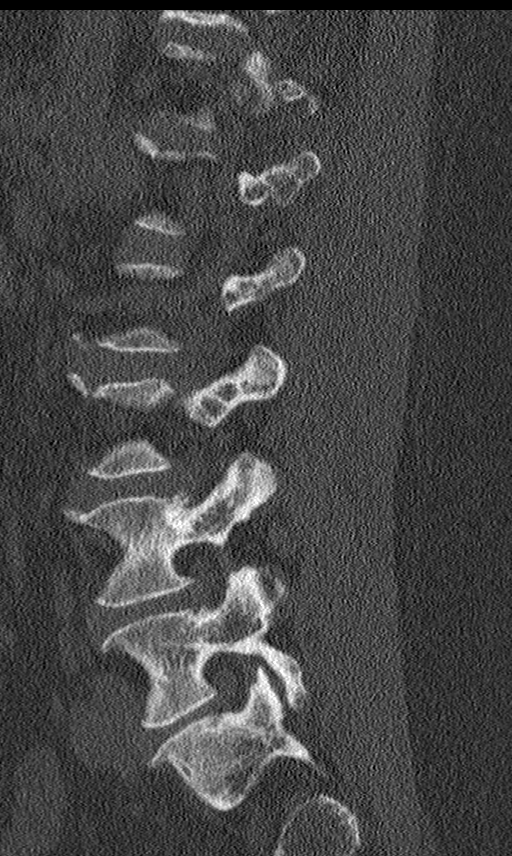

[Series 6: coronal bone · coronal · 0.35mm/px · 3 of 65 slices shown]
[im 13/65  bone]
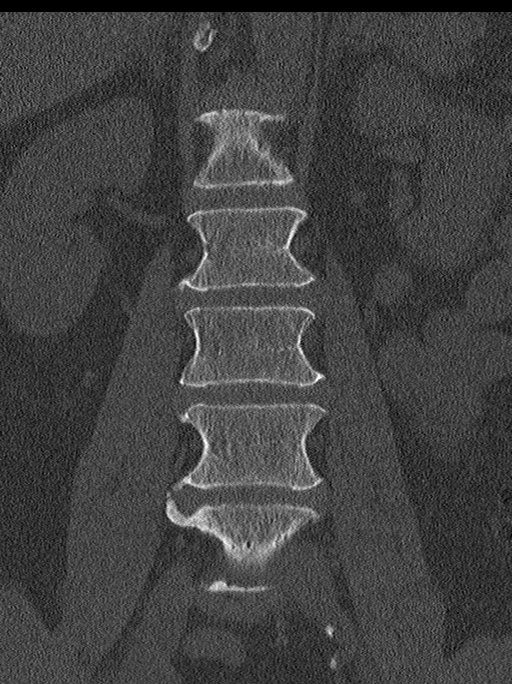
[im 26/65  bone]
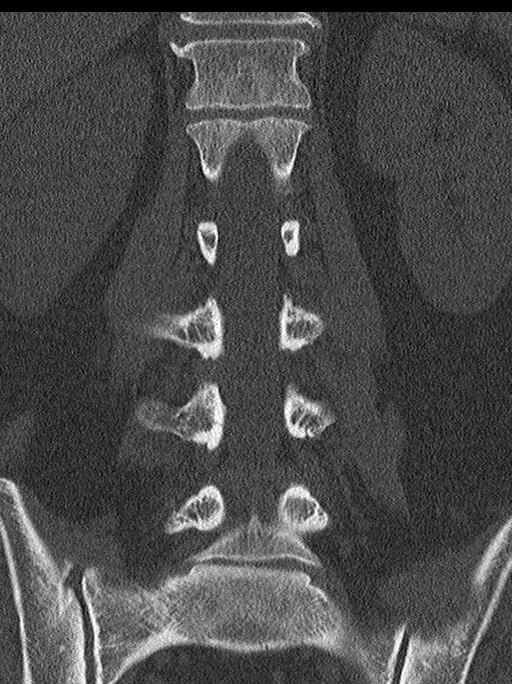
[im 39/65  bone]
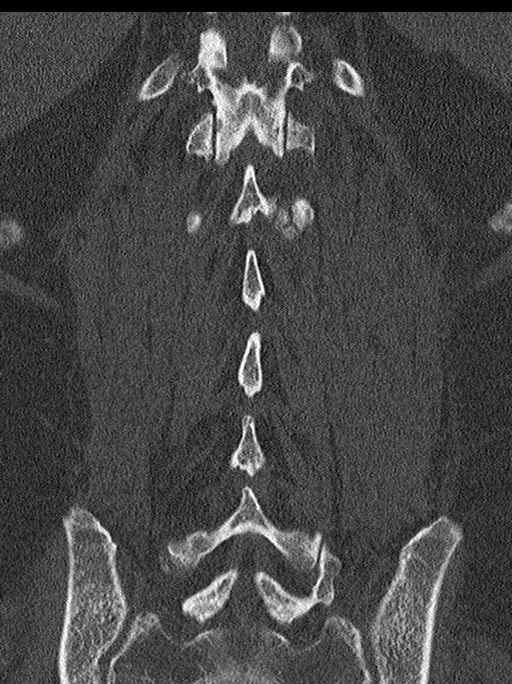

[12 of 33 positions shown; findings below may reference images not displayed]

FINDINGS: Segmentation: 5 lumbar type vertebrae.

Alignment: Normal.

Vertebrae: No acute fracture or focal pathologic process.

Paraspinal and other soft tissues: Right nephrolithiasis is noted.
No paraspinal abnormality is noted. No significant central spinal
canal stenosis is noted.

Disc levels: Mild degenerative disc disease is noted at L4-5.
Moderate degenerative disc disease is noted at L5-S1. Anterior
osteophyte formation is noted at L2-3, L3-4, L4-5 and L5-S1.
IMPRESSION: Multilevel degenerative disc disease is noted. No acute abnormality
is noted in the lumbar spine. Right nonobstructive nephrolithiasis
is noted.

## 2023-01-23 ENCOUNTER — Emergency Department (HOSPITAL_COMMUNITY)
Admission: EM | Admit: 2023-01-23 | Discharge: 2023-01-23 | Disposition: A | Discharge status: Home / Self Care | Payer: Self-pay | Attending: Emergency Medicine | Admitting: Emergency Medicine

## 2023-01-23 ENCOUNTER — Emergency Department (HOSPITAL_COMMUNITY): Payer: Self-pay

## 2023-01-23 ENCOUNTER — Encounter (HOSPITAL_COMMUNITY): Payer: Self-pay | Admitting: *Deleted

## 2023-01-23 ENCOUNTER — Other Ambulatory Visit: Payer: Self-pay

## 2023-01-23 DIAGNOSIS — S8012XA Contusion of left lower leg, initial encounter: Secondary | ICD-10-CM | POA: Diagnosis not present

## 2023-01-23 DIAGNOSIS — Y92410 Unspecified street and highway as the place of occurrence of the external cause: Secondary | ICD-10-CM | POA: Insufficient documentation

## 2023-01-23 DIAGNOSIS — S8992XA Unspecified injury of left lower leg, initial encounter: Secondary | ICD-10-CM | POA: Diagnosis present

## 2023-01-23 MED ORDER — HYDROCODONE-ACETAMINOPHEN 5-325 MG PO TABS: 1.0000 | ORAL_TABLET | ORAL | 0 refills | Status: AC | PRN

## 2023-01-23 MED ORDER — IBUPROFEN 600 MG PO TABS: 600.0000 mg | ORAL_TABLET | Freq: Four times a day (QID) | ORAL | 0 refills | Status: AC | PRN

## 2023-01-23 MED ORDER — KETOROLAC TROMETHAMINE 30 MG/ML IJ SOLN
30.0000 mg | Freq: Once | INTRAMUSCULAR | Status: AC
  Administered 2023-01-23: 30 mg via INTRAMUSCULAR
  Filled 2023-01-23: qty 1
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
## 2023-01-23 NOTE — ED Notes (Signed)
Patient transported to X-ray 
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
